# Patient Record
Sex: Male | Born: 1980
Health system: Southern US, Community
[De-identification: ages and names within clinical notes are randomized; demographics above are authoritative.]

---

## 2015-07-19 ENCOUNTER — Emergency Department (HOSPITAL_COMMUNITY): Payer: Self-pay

## 2015-07-19 ENCOUNTER — Other Ambulatory Visit: Payer: Self-pay

## 2015-07-19 ENCOUNTER — Encounter (HOSPITAL_COMMUNITY): Payer: Self-pay | Admitting: *Deleted

## 2015-07-19 ENCOUNTER — Inpatient Hospital Stay (HOSPITAL_COMMUNITY)
Admission: EM | Admit: 2015-07-19 | Discharge: 2015-07-23 | DRG: 082 | Disposition: A | Discharge status: Home / Self Care | Payer: Self-pay | Attending: General Surgery | Admitting: General Surgery

## 2015-07-19 DIAGNOSIS — J9690 Respiratory failure, unspecified, unspecified whether with hypoxia or hypercapnia: Secondary | ICD-10-CM

## 2015-07-19 DIAGNOSIS — Z01818 Encounter for other preprocedural examination: Secondary | ICD-10-CM

## 2015-07-19 DIAGNOSIS — S065XAA Traumatic subdural hemorrhage with loss of consciousness status unknown, initial encounter: Secondary | ICD-10-CM | POA: Diagnosis present

## 2015-07-19 DIAGNOSIS — S12100A Unspecified displaced fracture of second cervical vertebra, initial encounter for closed fracture: Secondary | ICD-10-CM | POA: Diagnosis present

## 2015-07-19 DIAGNOSIS — R402112 Coma scale, eyes open, never, at arrival to emergency department: Secondary | ICD-10-CM | POA: Diagnosis present

## 2015-07-19 DIAGNOSIS — S12110A Anterior displaced Type II dens fracture, initial encounter for closed fracture: Secondary | ICD-10-CM | POA: Diagnosis present

## 2015-07-19 DIAGNOSIS — R402212 Coma scale, best verbal response, none, at arrival to emergency department: Secondary | ICD-10-CM | POA: Diagnosis present

## 2015-07-19 DIAGNOSIS — R402332 Coma scale, best motor response, abnormal, at arrival to emergency department: Secondary | ICD-10-CM | POA: Diagnosis present

## 2015-07-19 DIAGNOSIS — R402354 Coma scale, best motor response, localizes pain, 24 hours or more after hospital admission: Secondary | ICD-10-CM | POA: Diagnosis not present

## 2015-07-19 DIAGNOSIS — G8929 Other chronic pain: Secondary | ICD-10-CM | POA: Diagnosis present

## 2015-07-19 DIAGNOSIS — S065X9A Traumatic subdural hemorrhage with loss of consciousness of unspecified duration, initial encounter: Principal | ICD-10-CM | POA: Diagnosis present

## 2015-07-19 DIAGNOSIS — J9600 Acute respiratory failure, unspecified whether with hypoxia or hypercapnia: Secondary | ICD-10-CM | POA: Diagnosis present

## 2015-07-19 DIAGNOSIS — R402144 Coma scale, eyes open, spontaneous, 24 hours or more after hospital admission: Secondary | ICD-10-CM | POA: Diagnosis not present

## 2015-07-19 DIAGNOSIS — R402254 Coma scale, best verbal response, oriented, 24 hours or more after hospital admission: Secondary | ICD-10-CM | POA: Diagnosis not present

## 2015-07-19 LAB — PREPARE FRESH FROZEN PLASMA
UNIT DIVISION: 0
Unit division: 0

## 2015-07-19 LAB — URINALYSIS, ROUTINE W REFLEX MICROSCOPIC
BILIRUBIN URINE: NEGATIVE
Glucose, UA: NEGATIVE mg/dL
Ketones, ur: NEGATIVE mg/dL
Leukocytes, UA: NEGATIVE
Nitrite: NEGATIVE
PH: 7 (ref 5.0–8.0)
Protein, ur: 30 mg/dL — AB

## 2015-07-19 LAB — TYPE AND SCREEN
ABO/RH(D): A POS
Antibody Screen: NEGATIVE
UNIT DIVISION: 0
UNIT DIVISION: 0

## 2015-07-19 LAB — I-STAT ARTERIAL BLOOD GAS, ED
ACID-BASE DEFICIT: 2 mmol/L (ref 0.0–2.0)
BICARBONATE: 23.6 meq/L (ref 20.0–24.0)
O2 Saturation: 100 %
PH ART: 7.351 (ref 7.350–7.450)
TCO2: 25 mmol/L (ref 0–100)
pCO2 arterial: 42.7 mmHg (ref 35.0–45.0)
pO2, Arterial: 185 mmHg — ABNORMAL HIGH (ref 80.0–100.0)

## 2015-07-19 LAB — I-STAT CG4 LACTIC ACID, ED: Lactic Acid, Venous: 1.99 mmol/L (ref 0.5–2.0)

## 2015-07-19 LAB — COMPREHENSIVE METABOLIC PANEL
ALBUMIN: 3.6 g/dL (ref 3.5–5.0)
ALT: 31 U/L (ref 17–63)
AST: 53 U/L — AB (ref 15–41)
Alkaline Phosphatase: 62 U/L (ref 38–126)
Anion gap: 10 (ref 5–15)
BILIRUBIN TOTAL: 1 mg/dL (ref 0.3–1.2)
BUN: 14 mg/dL (ref 6–20)
CHLORIDE: 107 mmol/L (ref 101–111)
CO2: 22 mmol/L (ref 22–32)
Calcium: 8.5 mg/dL — ABNORMAL LOW (ref 8.9–10.3)
Creatinine, Ser: 0.86 mg/dL (ref 0.61–1.24)
GFR calc Af Amer: >60 mL/min (ref >60)
GFR calc non Af Amer: >60 mL/min (ref >60)
GLUCOSE: 87 mg/dL (ref 65–99)
POTASSIUM: 4 mmol/L (ref 3.5–5.1)
Sodium: 139 mmol/L (ref 135–145)
Total Protein: 6.4 g/dL — ABNORMAL LOW (ref 6.5–8.1)

## 2015-07-19 LAB — I-STAT CHEM 8, ED
BUN: 15 mg/dL (ref 6–20)
CREATININE: 1 mg/dL (ref 0.61–1.24)
Calcium, Ion: 1.02 mmol/L — ABNORMAL LOW (ref 1.12–1.23)
Chloride: 106 mmol/L (ref 101–111)
Glucose, Bld: 84 mg/dL (ref 65–99)
HEMATOCRIT: 45 % (ref 39.0–52.0)
HEMOGLOBIN: 15.3 g/dL (ref 13.0–17.0)
Potassium: 4 mmol/L (ref 3.5–5.1)
Sodium: 141 mmol/L (ref 135–145)
TCO2: 24 mmol/L (ref 0–100)

## 2015-07-19 LAB — ETHANOL: Alcohol, Ethyl (B): 87 mg/dL — ABNORMAL HIGH (ref <5)

## 2015-07-19 LAB — CBC
HEMATOCRIT: 43.2 % (ref 39.0–52.0)
Hemoglobin: 14.4 g/dL (ref 13.0–17.0)
MCH: 29.6 pg (ref 26.0–34.0)
MCHC: 33.3 g/dL (ref 30.0–36.0)
MCV: 88.9 fL (ref 78.0–100.0)
Platelets: 146 10*3/uL — ABNORMAL LOW (ref 150–400)
RBC: 4.86 MIL/uL (ref 4.22–5.81)
RDW: 12.6 % (ref 11.5–15.5)
WBC: 12.2 10*3/uL — ABNORMAL HIGH (ref 4.0–10.5)

## 2015-07-19 LAB — ABO/RH: ABO/RH(D): A POS

## 2015-07-19 LAB — PROTIME-INR
INR: 1.18 (ref 0.00–1.49)
Prothrombin Time: 15.2 seconds (ref 11.6–15.2)

## 2015-07-19 LAB — URINE MICROSCOPIC-ADD ON
Bacteria, UA: NONE SEEN
WBC UA: NONE SEEN WBC/hpf (ref 0–5)

## 2015-07-19 LAB — SALICYLATE LEVEL

## 2015-07-19 LAB — MRSA PCR SCREENING: MRSA by PCR: NEGATIVE

## 2015-07-19 LAB — ACETAMINOPHEN LEVEL

## 2015-07-19 MED ORDER — KCL IN DEXTROSE-NACL 20-5-0.9 MEQ/L-%-% IV SOLN
INTRAVENOUS | Status: DC
  Administered 2015-07-20 – 2015-07-21 (×4): via INTRAVENOUS
  Filled 2015-07-19 (×9): qty 1000

## 2015-07-19 MED ORDER — PROPOFOL 1000 MG/100ML IV EMUL
INTRAVENOUS | Status: AC
  Filled 2015-07-19: qty 100

## 2015-07-19 MED ORDER — SUCCINYLCHOLINE CHLORIDE 20 MG/ML IJ SOLN
INTRAMUSCULAR | Status: AC | PRN
  Administered 2015-07-19: 100 mg via INTRAVENOUS

## 2015-07-19 MED ORDER — FENTANYL CITRATE (PF) 100 MCG/2ML IJ SOLN
INTRAMUSCULAR | Status: AC
  Filled 2015-07-19: qty 2

## 2015-07-19 MED ORDER — PROPOFOL 1000 MG/100ML IV EMUL
5.0000 ug/kg/min | INTRAVENOUS | Status: DC
  Administered 2015-07-19: 25 ug/kg/min via INTRAVENOUS
  Filled 2015-07-19 (×2): qty 100

## 2015-07-19 MED ORDER — FENTANYL CITRATE (PF) 100 MCG/2ML IJ SOLN
100.0000 ug | Freq: Once | INTRAMUSCULAR | Status: AC
  Administered 2015-07-19: 100 ug via INTRAVENOUS

## 2015-07-19 MED ORDER — HYDROMORPHONE HCL 1 MG/ML IJ SOLN
1.0000 mg | INTRAMUSCULAR | Status: DC | PRN
  Administered 2015-07-19 – 2015-07-22 (×7): 1 mg via INTRAVENOUS
  Filled 2015-07-19 (×7): qty 1

## 2015-07-19 MED ORDER — PROPOFOL 1000 MG/100ML IV EMUL
INTRAVENOUS | Status: AC | PRN
  Administered 2015-07-19: 5 ug/kg/min via INTRAVENOUS

## 2015-07-19 MED ORDER — ETOMIDATE 2 MG/ML IV SOLN
INTRAVENOUS | Status: AC | PRN
  Administered 2015-07-19: 15 mg via INTRAVENOUS

## 2015-07-19 MED ORDER — ONDANSETRON HCL 4 MG/2ML IJ SOLN: 4.0000 mg | Freq: Four times a day (QID) | INTRAMUSCULAR | Status: DC | PRN

## 2015-07-19 MED ORDER — FAMOTIDINE IN NACL 20-0.9 MG/50ML-% IV SOLN
20.0000 mg | Freq: Two times a day (BID) | INTRAVENOUS | Status: DC
  Administered 2015-07-19 – 2015-07-21 (×6): 20 mg via INTRAVENOUS
  Filled 2015-07-19 (×6): qty 50

## 2015-07-19 MED ORDER — PANTOPRAZOLE SODIUM 40 MG PO TBEC
40.0000 mg | DELAYED_RELEASE_TABLET | Freq: Two times a day (BID) | ORAL | Status: DC
  Filled 2015-07-19 (×2): qty 1

## 2015-07-19 MED ORDER — ONDANSETRON HCL 4 MG PO TABS: 4.0000 mg | ORAL_TABLET | Freq: Four times a day (QID) | ORAL | Status: DC | PRN

## 2015-07-19 MED ORDER — CHLORHEXIDINE GLUCONATE 0.12% ORAL RINSE (MEDLINE KIT)
15.0000 mL | Freq: Two times a day (BID) | OROMUCOSAL | Status: DC
  Administered 2015-07-19 – 2015-07-20 (×2): 15 mL via OROMUCOSAL

## 2015-07-19 MED ORDER — ANTISEPTIC ORAL RINSE SOLUTION (CORINZ)
7.0000 mL | OROMUCOSAL | Status: DC
  Administered 2015-07-19 – 2015-07-20 (×6): 7 mL via OROMUCOSAL

## 2015-07-19 MED ORDER — PNEUMOCOCCAL VAC POLYVALENT 25 MCG/0.5ML IJ INJ
0.5000 mL | INJECTION | INTRAMUSCULAR | Status: AC
  Administered 2015-07-20: 0.5 mL via INTRAMUSCULAR
  Filled 2015-07-19: qty 0.5

## 2015-07-19 NOTE — ED Notes (Signed)
Pt taken to ct with RN present
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
## 2015-07-19 NOTE — Progress Notes (Signed)
Patient intubated by ED physician with a 7.5 ETT taped at 22 cm at lip good color change on ETCO2 detector, SATS 100%, BBS equal, placed on above vent settings MD aware.
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
## 2015-07-19 NOTE — ED Notes (Signed)
Dr Madilyn Hookees spoke with Mother on the phone. Dad on his way here, lives 3 hours away.
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
## 2015-07-20 ENCOUNTER — Inpatient Hospital Stay (HOSPITAL_COMMUNITY): Payer: Self-pay

## 2015-07-20 ENCOUNTER — Inpatient Hospital Stay (HOSPITAL_COMMUNITY): Payer: No Typology Code available for payment source

## 2015-07-20 LAB — COMPREHENSIVE METABOLIC PANEL
ALT: 28 U/L (ref 17–63)
AST: 45 U/L — AB (ref 15–41)
Albumin: 3.3 g/dL — ABNORMAL LOW (ref 3.5–5.0)
Alkaline Phosphatase: 58 U/L (ref 38–126)
Anion gap: 8 (ref 5–15)
BUN: 8 mg/dL (ref 6–20)
CHLORIDE: 110 mmol/L (ref 101–111)
CO2: 22 mmol/L (ref 22–32)
CREATININE: 0.77 mg/dL (ref 0.61–1.24)
Calcium: 8.6 mg/dL — ABNORMAL LOW (ref 8.9–10.3)
GFR calc Af Amer: >60 mL/min (ref >60)
GLUCOSE: 95 mg/dL (ref 65–99)
Potassium: 4.1 mmol/L (ref 3.5–5.1)
SODIUM: 140 mmol/L (ref 135–145)
Total Bilirubin: 2.5 mg/dL — ABNORMAL HIGH (ref 0.3–1.2)
Total Protein: 5.9 g/dL — ABNORMAL LOW (ref 6.5–8.1)

## 2015-07-20 LAB — TRIGLYCERIDES: Triglycerides: 75 mg/dL (ref <150)

## 2015-07-20 LAB — CBC
HEMATOCRIT: 41.8 % (ref 39.0–52.0)
Hemoglobin: 13.5 g/dL (ref 13.0–17.0)
MCH: 29.6 pg (ref 26.0–34.0)
MCHC: 32.3 g/dL (ref 30.0–36.0)
MCV: 91.7 fL (ref 78.0–100.0)
Platelets: 121 10*3/uL — ABNORMAL LOW (ref 150–400)
RBC: 4.56 MIL/uL (ref 4.22–5.81)
RDW: 13 % (ref 11.5–15.5)
WBC: 14 10*3/uL — ABNORMAL HIGH (ref 4.0–10.5)

## 2015-07-20 LAB — BLOOD PRODUCT ORDER (VERBAL) VERIFICATION

## 2015-07-20 MED ORDER — FENTANYL CITRATE (PF) 100 MCG/2ML IJ SOLN
50.0000 ug | INTRAMUSCULAR | Status: DC | PRN
  Administered 2015-07-20 – 2015-07-22 (×6): 100 ug via INTRAVENOUS
  Filled 2015-07-20 (×6): qty 2

## 2015-07-20 MED ORDER — LORAZEPAM 2 MG/ML IJ SOLN
1.0000 mg | INTRAMUSCULAR | Status: DC | PRN
  Administered 2015-07-21: 2 mg via INTRAVENOUS
  Filled 2015-07-20: qty 1
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
## 2015-07-20 NOTE — Progress Notes (Signed)
Patient extubated. Patient awakens and will follow some simple commands bilaterally. States name but is not oriented otherwise. Motor 5/5 bilaterally.  Follow-up head CT scan today to evaluate for progression of subdural hematomas and possible cortical contusions. Continue current management.

## 2015-07-20 NOTE — Progress Notes (Signed)
Pt self extubated, no stridor noted. BBSH clear diminished. Pt is able to clear secretions on own. No distress noted.  Sat 99-100% on 4 lpm [place].  Pt is agitated and trying to climb over bed rails.  RN in room.

## 2015-07-20 NOTE — Progress Notes (Signed)
Utilization Review Completed.[name] T6/25/2017  

## 2015-07-21 MED ORDER — NICOTINE 21 MG/24HR TD PT24
21.0000 mg | MEDICATED_PATCH | Freq: Every day | TRANSDERMAL | Status: DC
  Administered 2015-07-21 – 2015-07-23 (×3): 21 mg via TRANSDERMAL
  Filled 2015-07-21 (×3): qty 1

## 2015-07-21 MED ORDER — BUPRENORPHINE HCL 8 MG SL SUBL
24.0000 mg | SUBLINGUAL_TABLET | Freq: Every day | SUBLINGUAL | Status: DC
  Administered 2015-07-21 – 2015-07-23 (×3): 24 mg via SUBLINGUAL
  Filled 2015-07-21 (×3): qty 3
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
## 2015-07-21 NOTE — Progress Notes (Signed)
Patient looks much better today. Mildly confused. Conversant. Following commands readily. No symptoms of motor or sensory dysfunction.  Follow-up head CT scan demonstrates essential resolution of small multifocal areas of subdural hematoma. No evidence of significant cortical or parenchymal hemorrhage.  Status post diffuse traumatic brain injury. Overall patient recovering well. Patient may be mobilized ad lib. from my standpoint. Okay to transfer out of ICU.
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
## 2015-07-21 NOTE — Progress Notes (Signed)
Subjective: Awake and alert  GCS 15 [name] Take subutex at home for chronic pain  and wants this restarted  Denies weakness or headache   Objective: Vital signs in last 24 hours: Temp:  [98.1 F (36.7 C)-99.9 F (37.7 C)] 98.4 F (36.9 C) (06/26 0800) Pulse Rate:  [43-102] 59 (06/26 0830) Resp:  [9-22] 14 (06/26 0830) BP: (112-143)/(60-84) 132/69 mmHg (06/26 0800) SpO2:  [99 %-100 %] 100 % (06/26 0830)    Intake/Output from previous day: 06/25 0701 - 06/26 0700 In: 2541.7 [I.V.:2441.7; IV Piggyback:100] Out: 2340 [Urine:2340] Intake/Output this shift: Total I/O In: 100 [I.V.:100] Out: -   Awake alert and cooperative  Lungs CTA Neck in collar Abdomen soft NT CV RRR  Neuro  Intact motor and sensory function GCS 15   Lab Results:   Recent Labs  07/19/15 0734 07/19/15 0745 07/20/15 0436  WBC 12.2*  --  14.0*  HGB 14.4 15.3 13.5  HCT 43.2 45.0 41.8  PLT 146*  --  121*   BMET  Recent Labs  07/19/15 0734 07/19/15 0745 07/20/15 0436  NA 139 141 140  K 4.0 4.0 4.1  CL 107 106 110  CO2 22  --  22  GLUCOSE 87 84 95  BUN 14 15 8   CREATININE 0.86 1.00 0.77  CALCIUM 8.5*  --  8.6*   PT/INR  Recent Labs  07/19/15 0734  LABPROT 15.2  INR 1.18   ABG  Recent Labs  07/19/15 0924  PHART 7.351  HCO3 23.6    Studies/Results: Ct Head Wo Contrast  07/20/2015  CLINICAL DATA:  Follow-up subdural hematoma. EXAM: CT HEAD WITHOUT CONTRAST TECHNIQUE: Contiguous axial images were obtained from the base of the skull through the vertex without intravenous contrast. COMPARISON:  July 19, 2015 FINDINGS: The scalp hematoma on the right remains similar in the interval. Extracranial soft tissues are otherwise normal. Mild mucosal thickening is seen in the ethmoid sinuses with fluid in the sphenoid sinus. Mastoid air cells and middle ears are well aerated. No acute bony abnormalities identified. Subdural blood is again seen adjacent to the falx, particularly on the  right. Anteriorly, the blood is slightly decreased. Posteriorly on series 2, image 24, the subdural blood is similar. The subdural blood over the left frontal lobe measures 5 mm today versus 7 mm previously, mildly smaller in the interval. The subdural hematoma adjacent to the left temporal lobe is similar in the interval measuring approximately 5 mm. Just deep to this subdural blood, there is a rounded focus of blood on series 2, image 7 surrounded by low-attenuation, likely representing a small amount of intraparenchymal hemorrhage. Recommend attention on follow-up. A small amount of blood layers along the right side of the tentorium, not significantly changed. There is a small amount of subarachnoid hemorrhage posteriorly on the left as seen on coronal image 57 and axial image 18 not seen previously. The blood in the right side of the quadrigeminal plate cistern is no longer visualized in this location. The cerebellum and brainstem are stable. The basal cisterns remain patent. No acute cortical ischemia or infarct. No blood seen within the ventricles. No midline shift. IMPRESSION: 1. There is a small rounded region of blood in the anterior left temporal lobe on series 2, image 7 measuring 5 mm, likely representing a small hemorrhagic contusion not seen previously. 2. There is a small amount of subarachnoid hemorrhage posteriorly on the left adjacent to the parietal lobe. This was not seen previously. However, since the subarachnoid  blood in the quadrigeminal plate on the previous study is no longer visualized, it is possible the posterior subarachnoid hemorrhage has simply migrated rather than representing true new hemorrhage. Recommend attention on follow-up. 3. The right subdural hemorrhage is smaller in the interval. Blood along the falx, right side of the tentorium, and adjacent to the left temporal lobe is stable. These results will be called to the ordering clinician or representative by the Radiologist  Assistant, and communication documented in the PACS or zVision Dashboard. Electronically Signed   By: Shane Samavid  Williams Harris M.D   On: 07/20/2015 18:29   Dg Chest Port 1 View  07/20/2015  CLINICAL DATA:  Trauma, subdural hemorrhage EXAM: PORTABLE CHEST 1 VIEW COMPARISON:  07/19/2015 FINDINGS: Cardiomediastinal silhouette is stable. No acute infiltrate or lung contusion. Endotracheal tube in place with tip 6 cm above the carina. NG tube with tip in proximal stomach. No pneumothorax. IMPRESSION: No infiltrate or lung contusion. No pneumothorax. Endotracheal tube in place. NG tube with tip in proximal stomach. The side hole is within distal esophagus. Advancement in mid stomach is recommended. Electronically Signed   By: Shane MeadLiviu  Pop M.D.   On: 07/20/2015 09:21    Anti-infectives: Anti-infectives    None      Assessment/Plan: Patient Active Problem List   Diagnosis Date Noted  . SDH (subdural hematoma) (HCC) 07/19/2015  C1 fracture hard collar   NSU following  Transfer patient Need TBI and swallowing study Takes subutex as home medication for chronic pain  will restart this today    LOS: 2 days    Shane Broadhead A. 07/21/2015
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
## 2015-07-22 ENCOUNTER — Encounter (HOSPITAL_COMMUNITY): Payer: Self-pay | Admitting: General Practice

## 2015-07-22 DIAGNOSIS — S12100A Unspecified displaced fracture of second cervical vertebra, initial encounter for closed fracture: Secondary | ICD-10-CM | POA: Diagnosis present

## 2015-07-22 DIAGNOSIS — G8929 Other chronic pain: Secondary | ICD-10-CM | POA: Diagnosis present

## 2015-07-22 MED ORDER — HYDROMORPHONE HCL 1 MG/ML IJ SOLN
1.0000 mg | INTRAMUSCULAR | Status: DC | PRN
  Administered 2015-07-22: 1 mg via INTRAVENOUS
  Filled 2015-07-22: qty 1

## 2015-07-22 MED ORDER — NAPROXEN 250 MG PO TABS
500.0000 mg | ORAL_TABLET | Freq: Two times a day (BID) | ORAL | Status: DC
  Administered 2015-07-22 – 2015-07-23 (×4): 500 mg via ORAL
  Filled 2015-07-22 (×4): qty 2

## 2015-07-22 MED ORDER — OXYCODONE HCL 5 MG PO TABS
10.0000 mg | ORAL_TABLET | ORAL | Status: DC | PRN
  Administered 2015-07-22: 15 mg via ORAL
  Administered 2015-07-23 (×2): 20 mg via ORAL
  Filled 2015-07-22: qty 4
  Filled 2015-07-22: qty 3
  Filled 2015-07-22: qty 4
  Filled 2015-07-22: qty 3

## 2015-07-22 MED ORDER — POLYETHYLENE GLYCOL 3350 17 G PO PACK
17.0000 g | PACK | Freq: Every day | ORAL | Status: DC
  Administered 2015-07-22 – 2015-07-23 (×2): 17 g via ORAL
  Filled 2015-07-22 (×2): qty 1

## 2015-07-22 MED ORDER — DOCUSATE SODIUM 100 MG PO CAPS
100.0000 mg | ORAL_CAPSULE | Freq: Two times a day (BID) | ORAL | Status: DC
Start: 2015-07-22 — End: 2015-07-23
  Administered 2015-07-22 – 2015-07-23 (×3): 100 mg via ORAL
  Filled 2015-07-22 (×3): qty 1
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
## 2015-07-22 NOTE — Progress Notes (Addendum)
Spoke with pt's mother, [name], by phone:  [phone]), with pt's permission.  Pt too lethargic to work with OT or to discuss dc plans with me at this time.  States he can stay with his mom or brother for "a day or two."  Mrs. Lowella FairyMiracle states that PTA, pt was independent and living [place] apartment [place].  She states that she has had contact with pt by phone while in the hospital, and he has been "out of it."  I reviewed physical and occupational therapy's recommendations with mother, and she states she plans to take him home with her at discharge.  She states that between her and her husband, they will provide 24hr supervision.  Mom also states that [facility], and will likely seek OP PT/OT there.  Pt will need Rx for OP PT/OT upon dc.    Pt's mother states she plans to come to [place] on Wednesday afternoon/evening.  Per MD notes, may dc home on Wednesday, and mother aware.  She appreciated my call.  Please notify Case Manager with any additional pt/family needs.    Quintella BatonJulie W. Jazara Swiney, RN, BSN  Trauma/Neuro ICU Case Manager [phone]
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
## 2015-07-22 NOTE — Progress Notes (Signed)
Patient continuously removes aspen collar. Patient educated on importance of wearing collar. Patient verbalizes understanding but continues to remove collar. Will notify MD.  Will continue to monitor. Asher Muir- Jerry Haugen,RN

## 2015-07-23 DIAGNOSIS — J9600 Acute respiratory failure, unspecified whether with hypoxia or hypercapnia: Secondary | ICD-10-CM | POA: Diagnosis present

## 2015-07-23 MED ORDER — OXYCODONE-ACETAMINOPHEN 10-325 MG PO TABS: 1.0000 | ORAL_TABLET | ORAL | Status: AC | PRN

## 2015-07-23 MED ORDER — NAPROXEN 500 MG PO TABS: 500.0000 mg | ORAL_TABLET | Freq: Two times a day (BID) | ORAL | Status: AC

## 2015-07-23 MED FILL — OXYCODONE-ACETAMINOPHEN 10-: 10-325 | 14 days supply | Qty: 168 | Fill #0

## 2015-07-23 MED FILL — NAPROXEN 500 MG TABLET: 500 | 34 days supply | Qty: 68 | Fill #0

## 2015-07-23 NOTE — Discharge Instructions (Signed)
Keep collar on at all times.  No driving until cleared by neurosurgery.  Take prescription to an outpatient rehabilitation facility for continue physical, occupational, and cognitive therapies.  Traumatic Brain Injury Traumatic brain injury (TBI) is an injury to your brain from a blow to your head (closed injury) or an object penetrating your skull and entering your brain (open injury). The severity of TBI varies significantly from one person to the next. Some TBIs cause you to pass out (lose consciousness) immediately and for a long period of time. Other TBIs do not cause any loss of consciousness.  Symptoms of any type of TBI can be long lasting (chronic). TBI can interfere with memory and speech. TBI can also cause chronic symptoms like headache or dizziness. CAUSES  TBI is caused by a closed or open injury. RISK FACTORS You may be at higher risk for TBI if you:  Are 75 or older.  Are a man.  Are in a car accident.  Play a contact sport, especially football, hockey, or soccer.  Do not wear protective gear while playing sports.  Are in the [employer].  Are a victim of violence.  Abuse drugs or alcohol.  Have had a previous TBI. SIGNS AND SYMPTOMS  Signs and symptoms of TBI may occur right away or not until days, weeks, or months after the injury. They may last for days, weeks, months, or years. Symptoms may include:  Loss of consciousness.  Headache.  Confusion.  Fatigue.  Changes in sleep.  Dizziness.  Mood or personality changes.  Memory problems.  Nausea or vomiting or both.  Seizures.  Clumsiness.  Slurred speech.  Depression and anxiety.  Anger.  Inability to control emotions or actions (impulse control).  Loss of or dulling of your senses, such as hearing, vision, and touch. This can include:  Blurred vision.  Ringing in your ears. DIAGNOSIS  TBI may be diagnosed by a medical history and physical exam. Your health care provider will also do  a neurologic exam to check your:  Reflexes.  Sensations.  Alertness.  Memory.  Vision.  Hearing.  Coordination. Your health care provider will also do tests to diagnose the extent of your TBI, such as a CT scan of your brain and skull. One way to determine the severity of your TBI is with a scoring system called the Glasgow Coma Scale (GCS). It measures eye opening, motor response, and verbal response. The higher the score, the milder the TBI.  Your TBI may be described as mild, moderate, or severe:   Mild TBI (concussion).  Symptoms of mild TBI usually go away on their own. This can take weeks or months, depending on the type of concussion.  Your GCS will be 13-15.  Your brain CT scan will be normal.  You may or may not have a short hospital stay.  Moderate TBI.  Your GCS will be 9-12.  Your brain CT scan will be abnormal.  You will likely need a short hospital stay.  Severe TBI.  Your GCS will be 3-8.  Your brain CT scan will be abnormal.  You may need a long stay in the hospital. TREATMENT Emergency treatment of TBI may involve measures to maintain a clear airway and stable blood pressure. Brain surgery may be needed to:  Remove a blood clot.  Repair bleeding.  Remove an object that has penetrated the brain, such as a skull fragment or a bullet. Other treatments depend on your chronic signs and symptoms. These treatments include  the following types of therapy:  Physical.  Occupational.  Speech and language.  Mental health.  Social support. HOME CARE INSTRUCTIONS  Carefully follow all your health care provider's instructions.  Work closely with all your therapists, if necessary.   Take medicines only as directed by your health care provider. Do not take aspirin or other anti-inflammatory medicines such as ibuprofen or naproxen unless approved by your health care provider.  Do not abuse illegal drugs.   Limit alcohol intake to no more than 1  drink per day for nonpregnant women and 2 drinks per day for men. One drink equals 12 ounces of beer, 5 ounces of wine, or 1 ounces of hard liquor.  Avoid any situation where there is potential for another head injury, such as football, hockey, soccer, basketball, martial arts, downhill snow sports, and horseback riding. Do not do these activities until your health care provider approves.   Rest. Rest helps the brain to heal. Make sure you:   Get plenty of sleep at night. Avoid staying up late at night.   Keep the same bedtime hours on weekends and weekdays.   Rest during the day. Take daytime naps or rest breaks when you feel tired.  Avoid excessive visual stimulation while recovering from a TBI. This includes work on the computer, watching TV, and reading.  Try to avoid activities that cause physical or mental stress. Stay home from work or school as directed by your health care provider.  Make lists to plan your day and help your memory.  Do not drive, ride a bicycle, or operate heavy machinery until your health care provider approves.  Seek support from friends and family.  Keep all follow-up visits as directed by your health care provider. This is important.  Watch your symptoms and tell others to do the same. Complications sometimes occur after a TBI. PREVENTION   Wear a helmet while biking, skiing, skateboarding, skating, or doing similar activities. Wear your seatbelt while driving.  Do not abuse alcohol or drugs.  Do not drink and drive.  Prevent falls at home by:  Removing clutter and tripping hazards, including loose rugs, from floors and stairways.  Using grab bars in bathrooms and handrails by stairs.   Placing nonslip mats on floors and in bathtubs.   Improving lighting in dim areas.  SEEK MEDICAL CARE IF: Seek medical care if you have any of the following symptoms for more than two weeks after your injury:  Chronic headaches.   Dizziness or  balance problems.   Nausea.   Vision problems.   Increased sensitivity to noise or light.   Depression or mood swings.   Anxiety or irritability.   Memory problems.   Difficulty concentrating or paying attention.   Sleep problems.   Feeling tired all the time.  SEEK IMMEDIATE MEDICAL CARE IF:  You have confusion or unusual drowsiness.  It is difficult to wake you up.   You have nausea or persistent, forceful vomiting.   You feel like you are moving when you are not (vertigo). Your eyes may move rapidly back and forth.  You have convulsions or faint.   You have severe, persistent headaches that are not relieved by medicine.   You cannot use your arms or legs normally.   One of your pupils is larger than the other.   You have clear or bloody discharge from your nose or ears.  Your problems are getting worse, not better.    This information is not  intended to replace advice given to you by your health care provider. Make sure you discuss any questions you have with your health care provider.   Document Released: 01/01/2002 Document Revised: 02/01/2014 Document Reviewed: 04/26/2013 Elsevier Interactive Patient Education 2016 Elsevier Inc.  Cervical Collar A cervical collar is a device that supports your chin and the back of your head. It is used after a severe neck injury to protect your head and neck. It does this by restricting the movement of the top part of your spine, which is located in your neck. A cervical collar may be used when you have:  A fractured neck.  Ligament damage.  A spinal cord injury. WHAT INSTRUCTIONS SHOULD I FOLLOW?  Wear the collar for as long as your health care provider instructs.  Follow your health care provider's instructions about how to put on and take off your collar.  Do not make your collar so tight that you feel pain or it is hard for you to breathe.  Do not remove the collar unless your health care  provider says it is okay. Ask your health care provider if you can remove the collar for showering or eating or to apply ice.  Do not drive a car until your health care provider says it is okay.  Keep all follow-up visits as directed by your health care provider. This is important. Any delay in getting necessary care can keep your injury from healing properly.  Apply ice to the injured area:  Put ice in a plastic bag.  Place a towel between your skin and the bag.  Leave the ice on for 20 minutes, 2-3 times per day for the first 2 days.   This information is not intended to replace advice given to you by your health care provider. Make sure you discuss any questions you have with your health care provider.   Document Released: 10/04/2003 Document Revised: 02/01/2014 Document Reviewed: 08/20/2013 Elsevier Interactive Patient Education [employer] Elsevier Inc.

## 2015-07-23 NOTE — Progress Notes (Signed)
RN discussed discharge instructions with patient's mother. Patient's mother verbalized understanding of follow up appointment with neurosurgery needed. PCP and trauma as needed. Patient had cane delivered. CT scans per MD request given to patient's mother. Patient's belongings from lock box given to patient's mother. She states he is missing a cell phone, no cell phone recorded on screenings. Patient's family made aware, patient's mother thinks it is not at hospital. Charge nurse made aware. Prescriptions and discharge instructions given to patient's mother. Sheet given to RN by CM given to patient's mother.

## 2015-07-23 NOTE — Progress Notes (Signed)
RN discussed discharge instructions with patient including medications, follow up appointments with neurosurgery, pt/ot/slp outpatient therapy. Prescriptions given to patient. IVs removed. Patient waiting for transportation home.
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
## 2015-07-23 NOTE — Care Management Note (Signed)
Case Management Note  Patient Details  Name: [name] MRN: [id number] Date of Birth: 12/03/1980  Subjective/Objective:     Pt medically stable for dc home today with mother.  PT/OT recommending OP follow up and DME.  Pt is uninsured, but is eligible for medication assistance [facility].                 Action/Plan: Called pt's mother, [name], who lives in [place], [place].  She states she will arrive in [place] around 5:00pm to take pt back home with her.  Reviewed PT/OT recommendations with mother; [facility].  Mom states that her sister has tub seat that they can borrow for pt to use.  Explained MATCH letter to mother, and explained that pt will be able to receive dc meds for $3/Rx at participating pharmacy.  She verbalizes understanding of this.  Cane to be delivered to patient's room prior to dc.  Will leave MATCH letter with Rx in pt chart at desk; notified bedside RN.    Expected Discharge Date:   06/28/207               Expected Discharge Plan:  Home/Self Care  In-House Referral:  Clinical Social Work  Discharge planning Services  CM Consult, MATCH Program, Medication Assistance  Post Acute Care Choice:  Durable Medical Equipment Choice offered to:  Parent  DME Arranged:  [facility] DME Agency:  [facility]  HH Arranged:    HH Agency:     Status of Service:  Completed, signed off  If discussed at [employer] Meetings, dates discussed:    Additional Comments:  Quintella BatonJulie W. Tristram Milian, RN, BSN  Trauma/Neuro ICU Case Manager [phone]

## 2016-10-29 IMAGING — CT CT HEAD W/O CM
3 series · 14 of 47 positions shown, 16 images · non-contrast
Comparison: July 19, 2015

CLINICAL DATA: Follow-up subdural hematoma.

EXAM:
CT HEAD WITHOUT CONTRAST
TECHNIQUE: Contiguous axial images were obtained from the base of the skull
through the vertex without intravenous contrast.

[Series 2: head 5.0 h30s · axial · 0.44mm/px · z∈[-39,+86]mm · 8 of 31 slices shown, 10 images]
[im 3/31  brain]
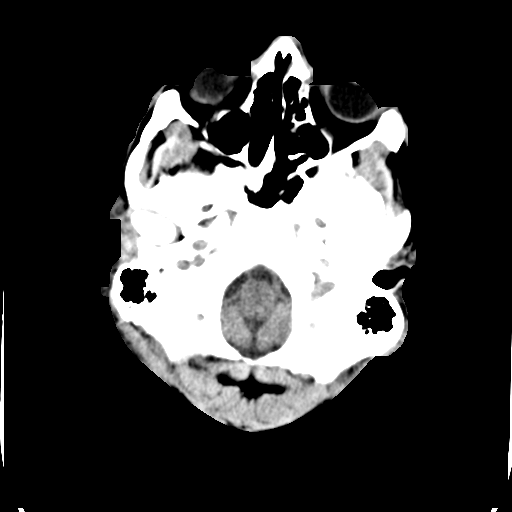
[im 3/31  bone]
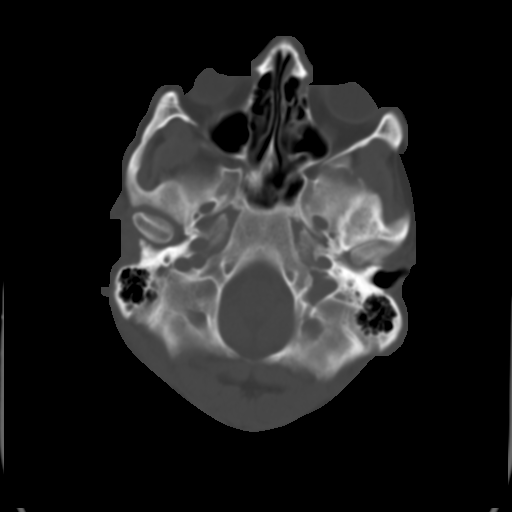
[im 7/31  brain]
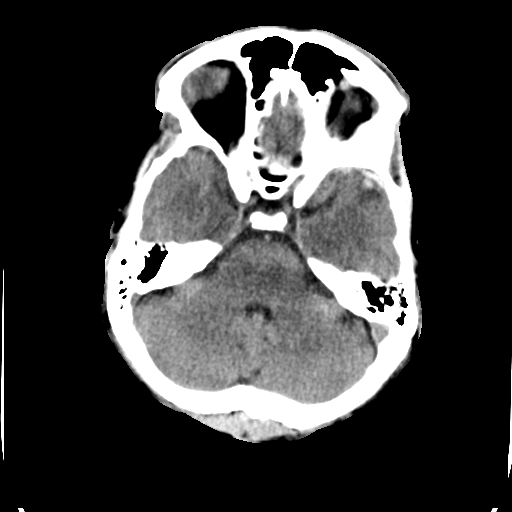
[im 10/31  brain]
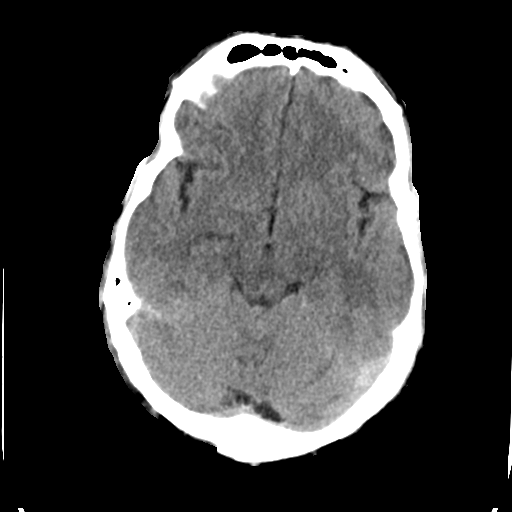
[im 14/31  brain]
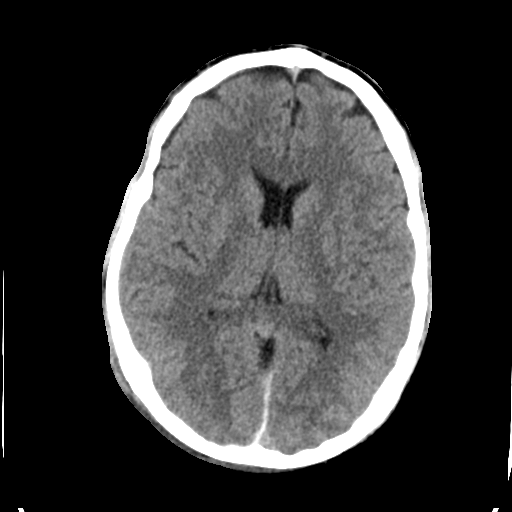
[im 17/31  brain]
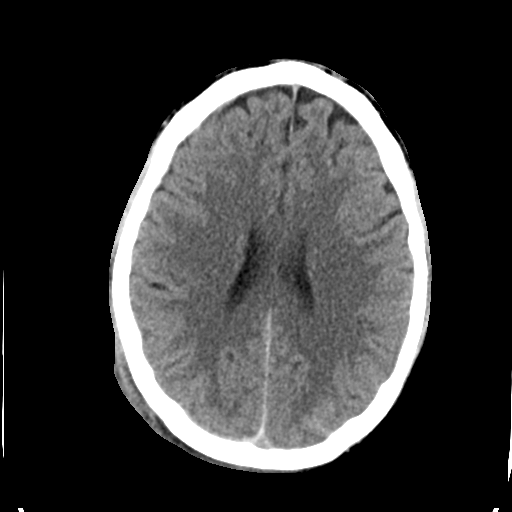
[im 17/31  bone]
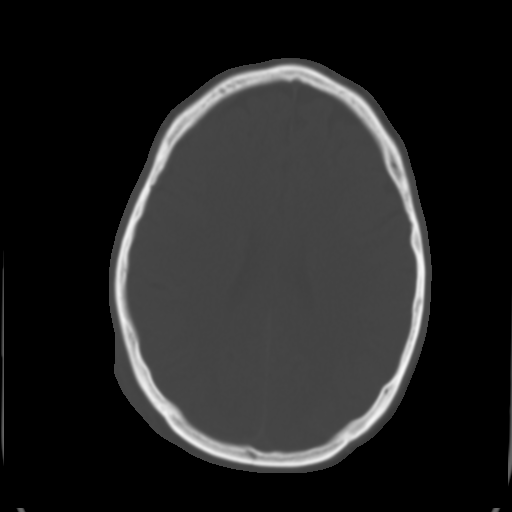
[im 21/31  brain]
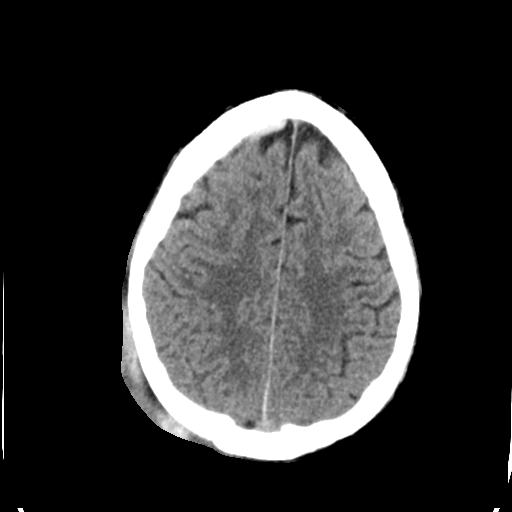
[im 24/31  brain]
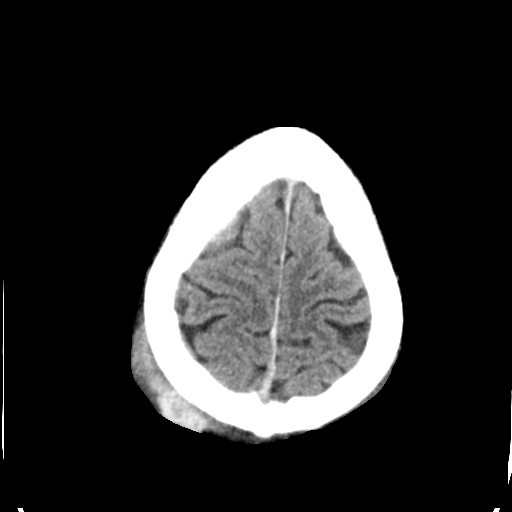
[im 28/31  brain]
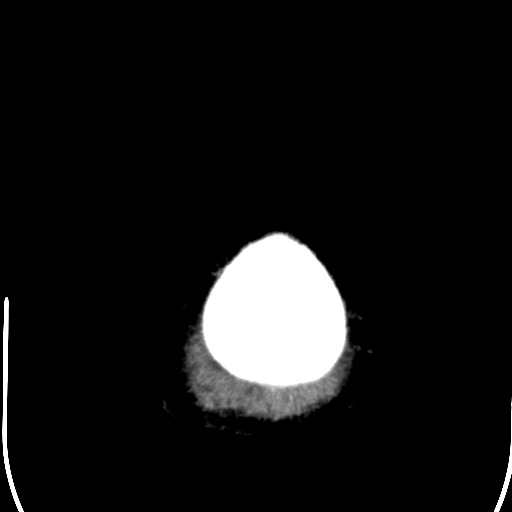

[Series 4: head 3.0 mpr · coronal · 0.32mm/px · 3 of 65 slices shown (1 of 2)]
[im 22/65  brain]
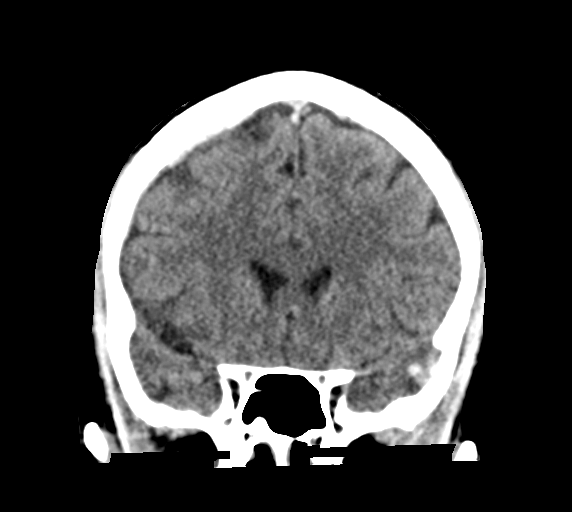
[im 29/65  brain]
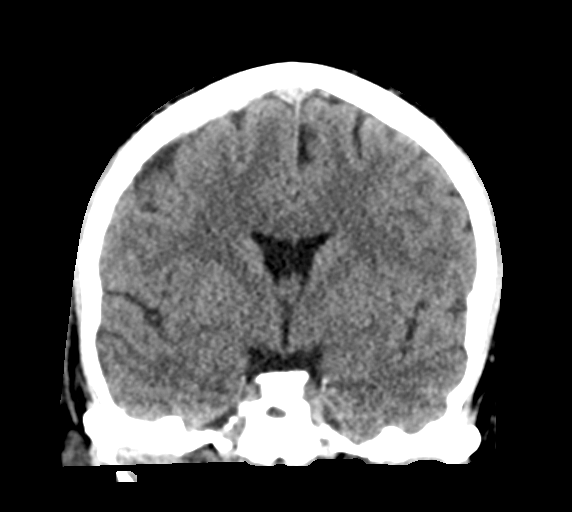
[im 36/65  brain]
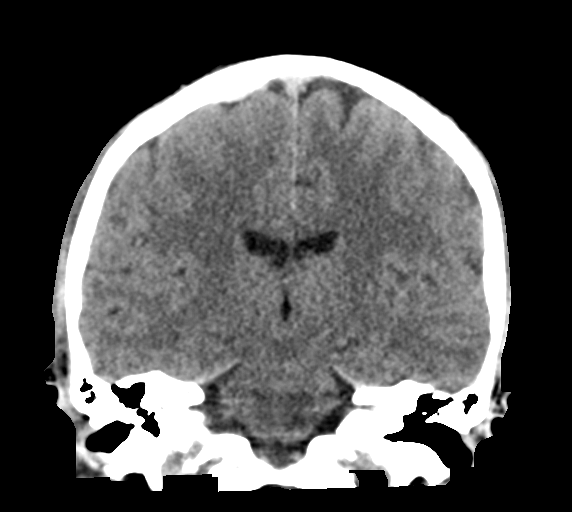

[Series 5: head 3.0 mpr · sagittal · 0.35mm/px · 3 of 61 slices shown (2 of 2)]
[im 21/61  brain]
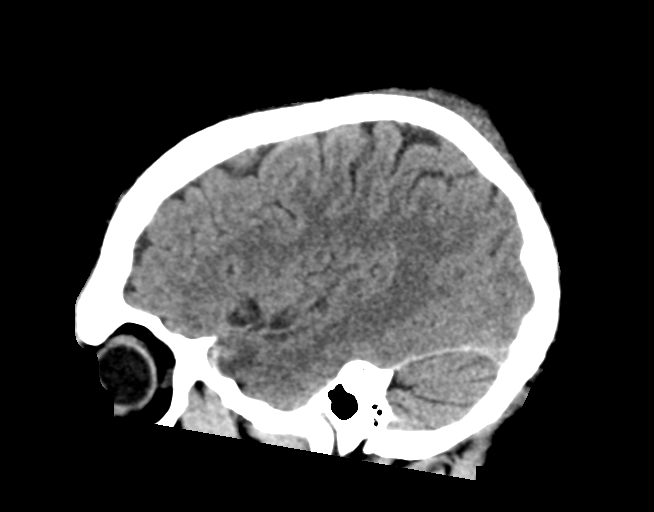
[im 31/61  brain]
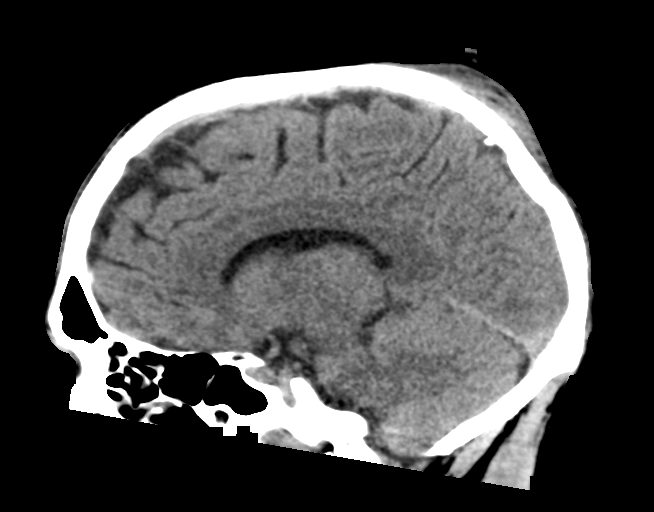
[im 41/61  brain]
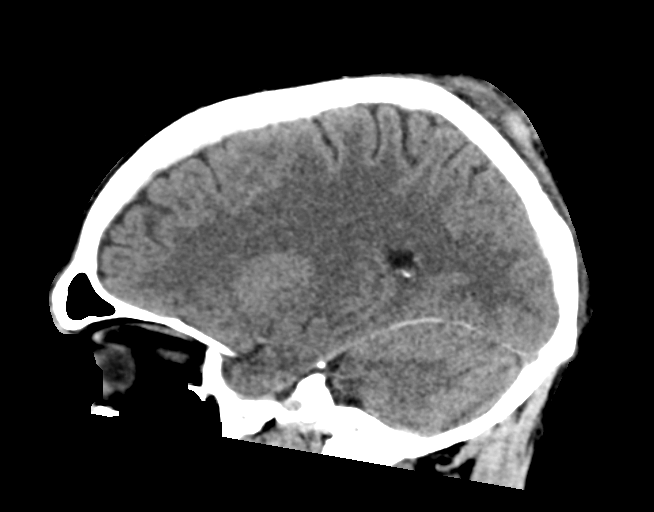

[14 of 47 positions shown; findings below may reference images not displayed]

FINDINGS: The scalp hematoma on the right remains similar in the interval.
Extracranial soft tissues are otherwise normal.

Mild mucosal thickening is seen in the ethmoid sinuses with fluid in
the sphenoid sinus. Mastoid air cells and middle ears are well
aerated. No acute bony abnormalities identified.

Subdural blood is again seen adjacent to the falx, particularly on
the right. Anteriorly, the blood is slightly decreased. Posteriorly
on series 2, image 24, the subdural blood is similar. The subdural
blood over the left frontal lobe measures 5 mm today versus 7 mm
previously, mildly smaller in the interval. The subdural hematoma
adjacent to the left temporal lobe is similar in the interval
measuring approximately 5 mm. Just deep to this subdural blood,
there is a rounded focus of blood on series 2, image 7 surrounded by
low-attenuation, likely representing a small amount of
intraparenchymal hemorrhage. Recommend attention on follow-up. A
small amount of blood layers along the right side of the tentorium,
not significantly changed. There is a small amount of subarachnoid
hemorrhage posteriorly on the left as seen on coronal image 57 and
axial image 18 not seen previously. The blood in the right side of
the quadrigeminal plate cistern is no longer visualized in this
location. The cerebellum and brainstem are stable. The basal
cisterns remain patent. No acute cortical ischemia or infarct. No
blood seen within the ventricles. No midline shift.
IMPRESSION: 1. There is a small rounded region of blood in the anterior left
temporal lobe on series 2, image 7 measuring 5 mm, likely
representing a small hemorrhagic contusion not seen previously.
2. There is a small amount of subarachnoid hemorrhage posteriorly on
the left adjacent to the parietal lobe. This was not seen
previously. However, since the subarachnoid blood in the
quadrigeminal plate on the previous study is no longer visualized,
it is possible the posterior subarachnoid hemorrhage has simply
migrated rather than representing true new hemorrhage. Recommend
attention on follow-up.
3. The right subdural hemorrhage is smaller in the interval. Blood
along the falx, right side of the tentorium, and adjacent to the
left temporal lobe is stable.
These results will be called to the ordering clinician or
representative by the Radiologist Assistant, and communication
documented in the PACS or zVision Dashboard.

## 2022-03-26 DEATH — deceased
